# Patient Record
Sex: Female | Born: 1960 | Race: White | Hispanic: No | Marital: Married | Smoking: Former smoker
Health system: Southern US, Community
[De-identification: ages and names within clinical notes are randomized; demographics above are authoritative.]

## PROBLEM LIST (undated history)

## (undated) DIAGNOSIS — R7303 Prediabetes: Secondary | ICD-10-CM

## (undated) DIAGNOSIS — E559 Vitamin D deficiency, unspecified: Secondary | ICD-10-CM

## (undated) DIAGNOSIS — K219 Gastro-esophageal reflux disease without esophagitis: Secondary | ICD-10-CM

## (undated) DIAGNOSIS — J45909 Unspecified asthma, uncomplicated: Secondary | ICD-10-CM

## (undated) DIAGNOSIS — I10 Essential (primary) hypertension: Secondary | ICD-10-CM

## (undated) DIAGNOSIS — L719 Rosacea, unspecified: Secondary | ICD-10-CM

## (undated) DIAGNOSIS — J189 Pneumonia, unspecified organism: Secondary | ICD-10-CM

## (undated) DIAGNOSIS — M1380 Other specified arthritis, unspecified site: Secondary | ICD-10-CM

## (undated) DIAGNOSIS — G4730 Sleep apnea, unspecified: Secondary | ICD-10-CM

## (undated) DIAGNOSIS — E049 Nontoxic goiter, unspecified: Secondary | ICD-10-CM

## (undated) DIAGNOSIS — E039 Hypothyroidism, unspecified: Secondary | ICD-10-CM

## (undated) DIAGNOSIS — M1990 Unspecified osteoarthritis, unspecified site: Secondary | ICD-10-CM

## (undated) DIAGNOSIS — D509 Iron deficiency anemia, unspecified: Secondary | ICD-10-CM

## (undated) HISTORY — DX: Essential (primary) hypertension: I10

## (undated) HISTORY — DX: Iron deficiency anemia, unspecified: D50.9

## (undated) HISTORY — DX: Vitamin D deficiency, unspecified: E55.9

## (undated) HISTORY — DX: Hypothyroidism, unspecified: E03.9

## (undated) HISTORY — DX: Unspecified asthma, uncomplicated: J45.909

## (undated) HISTORY — DX: Prediabetes: R73.03

## (undated) HISTORY — DX: Nontoxic goiter, unspecified: E04.9

## (undated) HISTORY — DX: Sleep apnea, unspecified: G47.30

## (undated) HISTORY — DX: Unspecified osteoarthritis, unspecified site: M19.90

## (undated) HISTORY — DX: Pneumonia, unspecified organism: J18.9

## (undated) HISTORY — DX: Rosacea, unspecified: L71.9

## (undated) HISTORY — DX: Gastro-esophageal reflux disease without esophagitis: K21.9

## (undated) HISTORY — DX: Other specified arthritis, unspecified site: M13.80

---

## 2001-12-26 ENCOUNTER — Encounter: Payer: Self-pay | Admitting: Surgery

## 2001-12-26 ENCOUNTER — Encounter: Admission: RE | Admit: 2001-12-26 | Discharge: 2001-12-26 | Payer: Self-pay | Admitting: Surgery

## 2002-08-06 ENCOUNTER — Encounter: Admission: RE | Admit: 2002-08-06 | Discharge: 2002-08-06 | Payer: Self-pay | Admitting: Surgery

## 2002-08-06 ENCOUNTER — Encounter: Payer: Self-pay | Admitting: Surgery

## 2003-09-02 ENCOUNTER — Ambulatory Visit (HOSPITAL_COMMUNITY): Admission: RE | Admit: 2003-09-02 | Discharge: 2003-09-02 | Payer: Self-pay

## 2004-09-07 ENCOUNTER — Ambulatory Visit (HOSPITAL_COMMUNITY): Admission: RE | Admit: 2004-09-07 | Discharge: 2004-09-07 | Payer: Self-pay

## 2007-03-17 ENCOUNTER — Ambulatory Visit (HOSPITAL_COMMUNITY): Admission: RE | Admit: 2007-03-17 | Discharge: 2007-03-17 | Payer: Self-pay

## 2007-03-24 ENCOUNTER — Encounter: Admission: RE | Admit: 2007-03-24 | Discharge: 2007-03-24 | Payer: Self-pay

## 2007-10-30 ENCOUNTER — Encounter: Admission: RE | Admit: 2007-10-30 | Discharge: 2007-10-30 | Payer: Self-pay

## 2008-06-12 ENCOUNTER — Encounter: Admission: RE | Admit: 2008-06-12 | Discharge: 2008-06-12 | Payer: Self-pay

## 2009-03-10 ENCOUNTER — Encounter: Admission: RE | Admit: 2009-03-10 | Discharge: 2009-03-10 | Payer: Self-pay

## 2010-06-19 ENCOUNTER — Encounter: Admission: RE | Admit: 2010-06-19 | Discharge: 2010-06-19 | Payer: Self-pay

## 2011-05-28 ENCOUNTER — Other Ambulatory Visit: Payer: Self-pay | Admitting: Family Medicine

## 2011-05-28 DIAGNOSIS — Z1231 Encounter for screening mammogram for malignant neoplasm of breast: Secondary | ICD-10-CM

## 2011-06-25 ENCOUNTER — Ambulatory Visit: Payer: Self-pay

## 2011-07-05 ENCOUNTER — Other Ambulatory Visit: Payer: Self-pay | Admitting: Family Medicine

## 2011-07-05 DIAGNOSIS — Z780 Asymptomatic menopausal state: Secondary | ICD-10-CM

## 2011-07-09 ENCOUNTER — Ambulatory Visit
Admission: RE | Admit: 2011-07-09 | Discharge: 2011-07-09 | Disposition: A | Discharge status: Home / Self Care | Payer: BC Managed Care – PPO | Source: Ambulatory Visit | Attending: Family Medicine | Admitting: Family Medicine

## 2011-07-09 ENCOUNTER — Ambulatory Visit: Payer: Self-pay

## 2011-07-09 DIAGNOSIS — Z1231 Encounter for screening mammogram for malignant neoplasm of breast: Secondary | ICD-10-CM

## 2011-07-19 ENCOUNTER — Other Ambulatory Visit: Payer: Self-pay

## 2013-09-03 ENCOUNTER — Other Ambulatory Visit: Payer: Self-pay | Admitting: Family Medicine

## 2013-09-03 DIAGNOSIS — N951 Menopausal and female climacteric states: Secondary | ICD-10-CM

## 2013-09-03 DIAGNOSIS — Z1231 Encounter for screening mammogram for malignant neoplasm of breast: Secondary | ICD-10-CM

## 2013-09-20 ENCOUNTER — Ambulatory Visit: Payer: BC Managed Care – PPO

## 2015-12-15 ENCOUNTER — Encounter: Payer: Self-pay | Admitting: Gastroenterology

## 2015-12-15 HISTORY — PX: COLONOSCOPY: SHX174

## 2016-01-19 DIAGNOSIS — D649 Anemia, unspecified: Secondary | ICD-10-CM | POA: Diagnosis not present

## 2016-05-21 DIAGNOSIS — D509 Iron deficiency anemia, unspecified: Secondary | ICD-10-CM | POA: Diagnosis not present

## 2016-10-28 DIAGNOSIS — Z862 Personal history of diseases of the blood and blood-forming organs and certain disorders involving the immune mechanism: Secondary | ICD-10-CM | POA: Diagnosis not present

## 2017-03-07 HISTORY — PX: ESOPHAGOGASTRODUODENOSCOPY: SHX1529

## 2018-01-25 ENCOUNTER — Telehealth: Payer: Self-pay

## 2018-01-25 MED ORDER — PANTOPRAZOLE SODIUM 40 MG PO TBEC: 40.0000 mg | DELAYED_RELEASE_TABLET | Freq: Every day | ORAL | 5 refills | Status: DC

## 2018-01-25 NOTE — Telephone Encounter (Signed)
Sent refills to patients pharmacy.  

## 2018-05-04 DIAGNOSIS — E039 Hypothyroidism, unspecified: Secondary | ICD-10-CM

## 2018-05-04 DIAGNOSIS — J9601 Acute respiratory failure with hypoxia: Secondary | ICD-10-CM | POA: Diagnosis not present

## 2018-05-04 DIAGNOSIS — Z8709 Personal history of other diseases of the respiratory system: Secondary | ICD-10-CM

## 2018-05-04 DIAGNOSIS — J45909 Unspecified asthma, uncomplicated: Secondary | ICD-10-CM

## 2018-05-04 DIAGNOSIS — K219 Gastro-esophageal reflux disease without esophagitis: Secondary | ICD-10-CM

## 2018-05-04 DIAGNOSIS — F329 Major depressive disorder, single episode, unspecified: Secondary | ICD-10-CM

## 2018-05-04 DIAGNOSIS — I10 Essential (primary) hypertension: Secondary | ICD-10-CM

## 2018-05-04 DIAGNOSIS — J189 Pneumonia, unspecified organism: Secondary | ICD-10-CM

## 2018-05-05 DIAGNOSIS — J45909 Unspecified asthma, uncomplicated: Secondary | ICD-10-CM | POA: Diagnosis not present

## 2018-05-05 DIAGNOSIS — J189 Pneumonia, unspecified organism: Secondary | ICD-10-CM | POA: Diagnosis not present

## 2018-05-05 DIAGNOSIS — F329 Major depressive disorder, single episode, unspecified: Secondary | ICD-10-CM | POA: Diagnosis not present

## 2018-05-05 DIAGNOSIS — J9601 Acute respiratory failure with hypoxia: Secondary | ICD-10-CM | POA: Diagnosis not present

## 2018-05-06 DIAGNOSIS — J9601 Acute respiratory failure with hypoxia: Secondary | ICD-10-CM | POA: Diagnosis not present

## 2018-05-06 DIAGNOSIS — J189 Pneumonia, unspecified organism: Secondary | ICD-10-CM | POA: Diagnosis not present

## 2018-05-06 DIAGNOSIS — F329 Major depressive disorder, single episode, unspecified: Secondary | ICD-10-CM | POA: Diagnosis not present

## 2018-05-06 DIAGNOSIS — J45909 Unspecified asthma, uncomplicated: Secondary | ICD-10-CM | POA: Diagnosis not present

## 2018-08-01 DIAGNOSIS — R0600 Dyspnea, unspecified: Secondary | ICD-10-CM

## 2018-09-11 ENCOUNTER — Encounter: Payer: Self-pay | Admitting: Internal Medicine

## 2018-09-11 DIAGNOSIS — F329 Major depressive disorder, single episode, unspecified: Secondary | ICD-10-CM

## 2018-09-11 DIAGNOSIS — I10 Essential (primary) hypertension: Secondary | ICD-10-CM | POA: Diagnosis not present

## 2018-09-11 DIAGNOSIS — K219 Gastro-esophageal reflux disease without esophagitis: Secondary | ICD-10-CM

## 2018-09-11 DIAGNOSIS — E039 Hypothyroidism, unspecified: Secondary | ICD-10-CM

## 2018-09-11 DIAGNOSIS — J45909 Unspecified asthma, uncomplicated: Secondary | ICD-10-CM | POA: Diagnosis not present

## 2018-09-11 DIAGNOSIS — J9601 Acute respiratory failure with hypoxia: Secondary | ICD-10-CM | POA: Diagnosis not present

## 2018-09-12 DIAGNOSIS — J189 Pneumonia, unspecified organism: Secondary | ICD-10-CM | POA: Diagnosis not present

## 2018-09-12 DIAGNOSIS — J45909 Unspecified asthma, uncomplicated: Secondary | ICD-10-CM | POA: Diagnosis not present

## 2018-09-12 DIAGNOSIS — Z789 Other specified health status: Secondary | ICD-10-CM

## 2018-09-12 DIAGNOSIS — J9601 Acute respiratory failure with hypoxia: Secondary | ICD-10-CM | POA: Diagnosis not present

## 2018-09-13 DIAGNOSIS — J189 Pneumonia, unspecified organism: Secondary | ICD-10-CM | POA: Diagnosis not present

## 2018-09-13 DIAGNOSIS — Z789 Other specified health status: Secondary | ICD-10-CM | POA: Diagnosis not present

## 2018-09-13 DIAGNOSIS — J9601 Acute respiratory failure with hypoxia: Secondary | ICD-10-CM | POA: Diagnosis not present

## 2018-09-13 DIAGNOSIS — J45909 Unspecified asthma, uncomplicated: Secondary | ICD-10-CM | POA: Diagnosis not present

## 2018-09-20 ENCOUNTER — Ambulatory Visit: Payer: Managed Care, Other (non HMO) | Admitting: Allergy and Immunology

## 2018-10-11 ENCOUNTER — Ambulatory Visit: Payer: Managed Care, Other (non HMO) | Admitting: Allergy and Immunology

## 2018-12-21 ENCOUNTER — Telehealth (INDEPENDENT_AMBULATORY_CARE_PROVIDER_SITE_OTHER): Payer: Managed Care, Other (non HMO) | Admitting: Gastroenterology

## 2018-12-21 ENCOUNTER — Encounter: Payer: Self-pay | Admitting: Gastroenterology

## 2018-12-21 ENCOUNTER — Other Ambulatory Visit: Payer: Self-pay

## 2018-12-21 VITALS — Ht 69.0 in | Wt 200.0 lb

## 2018-12-21 DIAGNOSIS — E611 Iron deficiency: Secondary | ICD-10-CM

## 2018-12-21 DIAGNOSIS — K760 Fatty (change of) liver, not elsewhere classified: Secondary | ICD-10-CM

## 2018-12-21 MED ORDER — PANTOPRAZOLE SODIUM 40 MG PO TBEC: 40.0000 mg | DELAYED_RELEASE_TABLET | ORAL | 0 refills | Status: DC

## 2018-12-21 NOTE — Patient Instructions (Signed)
If you are age 60 or older, your body mass index should be between 23-30. Your Body mass index is 29.53 kg/m. If this is out of the aforementioned range listed, please consider follow up with your Primary Care Provider.  If you are age 51 or younger, your body mass index should be between 19-25. Your Body mass index is 29.53 kg/m. If this is out of the aformentioned range listed, please consider follow up with your Primary Care Provider.   To help prevent the possible spread of infection to our patients, communities, and staff; we will be implementing the following measures:  As of now we are not allowing any visitors/family members to accompany you to any upcoming appointments with [facility]. If you have any concerns about this please contact our office to discuss prior to the appointment.   You have been scheduled for an abdominal ultrasound at [facility] (1st floor of hospital) on 12/28/2018 at 8:00am. Please arrive 15 minutes prior to your appointment for registration. Make certain not to have anything to eat or drink 6 hours prior to your appointment. Should you need to reschedule your appointment, please contact radiology at [phone]. This test typically takes about 30 minutes to perform.  We have sent the following medications to your pharmacy for you to pick up at your convenience: Protonix 40mg  every other day for 1 month. If you have no upper GI symptoms after 1 month you may begin to take Protonix 40mg  as needed.  Thank you,  Dr. Jackquline Denmark

## 2018-12-21 NOTE — Progress Notes (Signed)
Chief Complaint: FU  Referring Provider:  Nicoletta Dress, MD      ASSESSMENT AND PLAN;   #1.  Iron deficiency without anemia.  Heme-negative 2018. Neg colon to TI 11/2015, negative EGD 02/2017. CT 12/2015: Negative except for mild splenomegaly, fatty liver.  No liver cirrhosis/portal hypertension.  #2.  Fatty liver with mildly abnormal LFTs. [place] 01/2017-fatty liver, mild splenomegaly 16.1 cm.  No liver cirrhosis/portal hypertension. Neg acute hepatitis panel, AMA, ASMA 11/2015. No ETOH.  Plan: -Ultrasound abdomen with hepatic elastography. -Decrease Protonix to every other day for 1 month.  If there are no upper GI symptoms, then she can use it on PRN basis.  May help iron absorption as well. -Continue iron supplements. -Hemoccult x 3. -Obtain results of CMP, iron studies from Dr. Denton Lank office. -Trend CBC, LFTs.  She is scheduled for blood work towards June end. -Hold off on repeat EGD or colonoscopy at the present time. -Encouraged her to lose weight.   HPI:    Maria Weber is a 58 y.o. female  For iron deficiency without anemia. She is doing well with twice a day iron supplements.  No nausea, vomiting, heartburn, regurgitation, odynophagia or dysphagia.  No significant diarrhea or constipation.  There is no melena or hematochezia. No unintentional weight loss. No abdominal pain.  Denies excessive menstrual cycles/hematuria/nosebleeds.  Has longstanding history of iron deficiency.  No jaundice dark urine or pale stools.    Labs-September 26, 2018 hemoglobin 13.1, MCV 76, platelets 203, WBC count 9.6.  Liver function tests per patient AST/ALT in 50s.  Past GI procedures: -Colonoscopy 12/15/2015 ([facility])-good prep, 6 mm colonic polyp status post polypectomy.  Biopsies hyperplastic, mild sigmoid diverticulosis.  Otherwise normal to TI.  03/2013-hyperplastic colonic polyps, small internal hemorrhoids. -EGD 03/07/2017 normal.  Previously negative small bowel biopsies for  celiac.  Gastric biopsies were negative for intestinal metaplasia at last EGD.  SH-fell in our office previously/had grandbaby with her at that time. Past Medical History:  Diagnosis Date  . Allergic arthritis   . Arthritis   . Asthma   . GERD (gastroesophageal reflux disease)   . Goiter, non-toxic   . Hypertension   . Hypothyroidism   . Iron deficiency anemia   . Pneumonia    october 2019 and february 2020 Oval Linsey   . Rosacea   . Vitamin D deficiency     Past Surgical History:  Procedure Laterality Date  . CESAREAN SECTION     x2  . COLONOSCOPY  12/15/2015   Colonic polyp status post polypectomy. Mild sigmoid diverticulosis. No explanation of iron deficiency anemia  . ESOPHAGOGASTRODUODENOSCOPY  03/07/2017   Mild gastritis. Otherwise normal EGD    Family History  Problem Relation Age of Onset  . Bladder Cancer Father   . Esophageal cancer Neg Hx   . Colon cancer Neg Hx     Social History   Tobacco Use  . Smoking status: Former Research scientist (life sciences)  . Smokeless tobacco: Never Used  . Tobacco comment: smoked for one year when she was 16  Substance Use Topics  . Alcohol use: Not Currently  . Drug use: Never    Current Outpatient Medications  Medication Sig Dispense Refill  . albuterol (VENTOLIN HFA) 108 (90 Base) MCG/ACT inhaler Inhale 1 puff into the lungs as needed for wheezing or shortness of breath.    Francia Greaves THYROID 60 MG tablet Take 60 mg by mouth daily.    Marland Kitchen CO-ENZYME Q10 PO Take 400 mg by mouth  daily.    . ferrous sulfate (IRON SUPPLEMENT) 325 (65 FE) MG tablet Take 325 mg by mouth daily with breakfast.    . fluticasone furoate-vilanterol (BREO ELLIPTA) 200-25 MCG/INH AEPB Inhale 1 puff into the lungs daily.    Marland Kitchen ipratropium-albuterol (DUONEB) 0.5-2.5 (3) MG/3ML SOLN Take 3 mLs by nebulization as needed. 2.5mg /56ml    . lisinopril-hydrochlorothiazide (ZESTORETIC) 20-12.5 MG tablet 0.5 tablets daily.    Marland Kitchen loratadine (CLARITIN) 10 MG tablet Take 10 mg by mouth daily.     . Milk Thstle-Artchke-Dand-Licor (SUPER MILK THISTLE X PO) Take 2 tablets by mouth daily.    . pantoprazole (PROTONIX) 40 MG tablet Take 1 tablet (40 mg total) by mouth daily. 30 tablet 5  . sertraline (ZOLOFT) 100 MG tablet Take 25 mg by mouth daily. Take a quarter tablet daily    . traZODone (DESYREL) 150 MG tablet 1 tablet daily.     No current facility-administered medications for this visit.     Allergies  Allergen Reactions  . Iodine   . Povidone   . Sulfa Antibiotics     Review of Systems:  Constitutional: Denies fever, chills, diaphoresis, appetite change and fatigue.  HEENT: Denies photophobia, eye pain, redness, hearing loss, ear pain, congestion, sore throat, rhinorrhea, sneezing, mouth sores, neck pain, neck stiffness and tinnitus.   Respiratory: Denies SOB, DOE, cough, chest tightness,  and wheezing.   Cardiovascular: Denies chest pain, palpitations and leg swelling.  Genitourinary: Denies dysuria, urgency, frequency, hematuria, flank pain and difficulty urinating.  Musculoskeletal: Denies myalgias, back pain, joint swelling, arthralgias and gait problem.  Skin: No rash.  Neurological: Denies dizziness, seizures, syncope, weakness, light-headedness, numbness and headaches.  Hematological: Denies adenopathy. Easy bruising, personal or family bleeding history  Psychiatric/Behavioral: No anxiety or depression     Physical Exam:    Ht 5\' 9"  (1.753 m)   Wt 200 lb (90.7 kg)   BMI 29.53 kg/m  Filed Weights   12/21/18 1109  Weight: 200 lb (90.7 kg)   Constitutional:  Well-developed, in no acute distress. Psychiatric: Normal mood and affect. Behavior is normal. Telemetry visit  Data Reviewed: I have personally reviewed following labs and imaging studies  Labs -September 26, 2018 hemoglobin 13.1, MCV 76, platelets 203, WBC count 9.6.  Liver function tests per patient AST/ALT in 50s.   This service was provided via telemedicine.  The patient was located at home.  The  provider was located in office.  The patient did consent to this telephone visit and is aware of possible charges through their insurance for this visit.  The patient was referred by Dr. Delena Bali.   Time spent on call: 25 min   Carmell Austria, MD 12/21/2018, 2:46 PM  Cc: Nicoletta Dress, MD

## 2018-12-28 ENCOUNTER — Ambulatory Visit (HOSPITAL_COMMUNITY)
Admission: RE | Admit: 2018-12-28 | Discharge: 2018-12-28 | Disposition: A | Discharge status: Home / Self Care | Payer: Managed Care, Other (non HMO) | Source: Ambulatory Visit | Attending: Gastroenterology | Admitting: Gastroenterology

## 2018-12-28 ENCOUNTER — Other Ambulatory Visit: Payer: Self-pay

## 2018-12-28 DIAGNOSIS — K760 Fatty (change of) liver, not elsewhere classified: Secondary | ICD-10-CM | POA: Diagnosis present

## 2018-12-28 DIAGNOSIS — E611 Iron deficiency: Secondary | ICD-10-CM | POA: Insufficient documentation

## 2018-12-29 ENCOUNTER — Other Ambulatory Visit: Payer: Self-pay | Admitting: *Deleted

## 2018-12-29 MED ORDER — VITAMIN E 180 MG (400 UNIT) PO CAPS: 400.0000 [IU] | ORAL_CAPSULE | Freq: Every day | ORAL | 1 refills | Status: AC

## 2019-01-05 ENCOUNTER — Other Ambulatory Visit: Payer: Managed Care, Other (non HMO)

## 2019-01-11 ENCOUNTER — Other Ambulatory Visit (INDEPENDENT_AMBULATORY_CARE_PROVIDER_SITE_OTHER): Payer: Managed Care, Other (non HMO)

## 2019-01-11 ENCOUNTER — Other Ambulatory Visit: Payer: Self-pay

## 2019-01-11 DIAGNOSIS — E611 Iron deficiency: Secondary | ICD-10-CM | POA: Diagnosis not present

## 2019-01-11 LAB — HEMOCCULT SLIDES (X 3 CARDS)
Fecal Occult Blood: NEGATIVE
OCCULT 1: NEGATIVE
OCCULT 2: NEGATIVE
OCCULT 3: NEGATIVE
OCCULT 4: NEGATIVE
OCCULT 5: NEGATIVE

## 2019-03-16 ENCOUNTER — Telehealth (INDEPENDENT_AMBULATORY_CARE_PROVIDER_SITE_OTHER): Payer: Managed Care, Other (non HMO) | Admitting: Gastroenterology

## 2019-03-16 ENCOUNTER — Other Ambulatory Visit: Payer: Self-pay

## 2019-03-16 VITALS — Ht 69.0 in | Wt 200.0 lb

## 2019-03-16 DIAGNOSIS — K219 Gastro-esophageal reflux disease without esophagitis: Secondary | ICD-10-CM | POA: Diagnosis not present

## 2019-03-16 DIAGNOSIS — K760 Fatty (change of) liver, not elsewhere classified: Secondary | ICD-10-CM

## 2019-03-16 NOTE — Patient Instructions (Signed)
Decrease Protonix 40 mg po qd. -Start exercising and reduce weight.  Exercise like walking 30 min/day, at least 3 x week.  -Record wt every day and bring it along the at the next FU visit. -Change diet to more grilled foods rather than fried foods. Can try Mediterranean diet. -Stop drinking sodas strictly.  Avoid foods with high fructose corn syrup. -Can have up to 2 cups of coffee per day. -Continue Vitamin E 400 IU po QD. -FU in 12 weeks.  Aim is to reduce 6 lbs over next 12 weeks. -At Dr Denton Lank office (Oct 30) CBC, CMP, anti-HAV total Ab and HBsAb titer.  If neg, would need vaccination for hepatitis A and B. -Continue milk thistle bid.

## 2019-03-16 NOTE — Progress Notes (Signed)
Chief Complaint: FU  Referring Provider:  Nicoletta Dress, MD      ASSESSMENT AND PLAN;   #1.  Iron deficiency without anemia.  Heme-negative 2018/2020. Neg colon to TI 11/2015, negative EGD 02/2017. CT 12/2015: Negative except for mild splenomegaly, fatty liver.  No liver cirrhosis/portal hypertension.  #2.  Fatty liver with mildly abn LFTs. USE 12/2018 (F2/some F3 fibrosis). [place] 01/2017-fatty liver, mild splenomegaly 16.1 cm.  No liver cirrhosis/portal hypertension. Neg acute hepatitis panel, ANA, AMA, ASMA 11/2015. No ETOH.  Plan: -Decrease Protonix 40 mg po qd. -Start exercising and reduce weight.  Exercise like walking 30 min/day, at least 3 x week.  -Record wt every day and bring it along the at the next FU visit. -Change diet to more grilled foods rather than fried foods. Can try Mediterranean diet. -Stop drinking sodas strictly.  Avoid foods with high fructose corn syrup. -Can have up to 2 cups of coffee per day. -Continue Vitamin E 400 IU po QD. -FU in 12 weeks.  Aim is to reduce 6 lbs over next 12 weeks. -At Dr Denton Lank office (Oct 30) CBC, CMP, anti-HAV total Ab and HBsAb titer.  If neg, would need vaccination for hepatitis A and B. -Continue milk thistle bid. -Hold off on repeat EGD or colonoscopy at the present time.   HPI:    Maria Weber is a 58 y.o. female  For iron deficiency without anemia. Had heme-negative stools She is doing well with twice a day iron supplements.  No nausea, vomiting, heartburn (has started taking Protonix once a day, had burning when she was taking it every other day), regurgitation, odynophagia or dysphagia.  No significant diarrhea or constipation.  There is no melena or hematochezia.   May have gained little weight.  Could not tell me as to how much.  Ultrasound elastography showed F2/some F3 fibrosis.  At moderate risk for cirrhosis. Spleen was mildly enlarged.  No portal hypertension.  Platelets were normal previously.  No  abdominal pain.  Denies excessive menstrual cycles/hematuria/nosebleeds.  Has longstanding history of iron deficiency.  No jaundice dark urine or pale stools.    Labs-September 26, 2018 hemoglobin 13.1, MCV 76, platelets 203, WBC count 9.6.  Liver function tests per patient AST/ALT in 50s.  Past GI procedures: -Colonoscopy 12/15/2015 ([facility])-good prep, 6 mm colonic polyp status post polypectomy.  Biopsies hyperplastic, mild sigmoid diverticulosis.  Otherwise normal to TI.  03/2013-hyperplastic colonic polyps, small internal hemorrhoids. -EGD 03/07/2017 normal.  Previously negative small bowel biopsies for celiac.  Gastric biopsies were negative for intestinal metaplasia at last EGD.  SH-fell in our office previously/had grandbaby with her at that time. Past Medical History:  Diagnosis Date  . Allergic arthritis   . Arthritis   . Asthma   . GERD (gastroesophageal reflux disease)   . Goiter, non-toxic   . Hypertension   . Hypothyroidism   . Iron deficiency anemia   . Pneumonia    october 2019 and february 2020 Oval Linsey   . Rosacea   . Vitamin D deficiency     Past Surgical History:  Procedure Laterality Date  . CESAREAN SECTION     x2  . COLONOSCOPY  12/15/2015   Colonic polyp status post polypectomy. Mild sigmoid diverticulosis. No explanation of iron deficiency anemia  . ESOPHAGOGASTRODUODENOSCOPY  03/07/2017   Mild gastritis. Otherwise normal EGD    Family History  Problem Relation Age of Onset  . Bladder Cancer Father   . Esophageal cancer Neg Hx   .  Colon cancer Neg Hx     Social History   Tobacco Use  . Smoking status: Former Research scientist (life sciences)  . Smokeless tobacco: Never Used  . Tobacco comment: smoked for one year when she was 16  Substance Use Topics  . Alcohol use: Not Currently  . Drug use: Never    Current Outpatient Medications  Medication Sig Dispense Refill  . albuterol (VENTOLIN HFA) 108 (90 Base) MCG/ACT inhaler Inhale 1 puff into the lungs as needed for  wheezing or shortness of breath.    Francia Greaves THYROID 60 MG tablet Take 60 mg by mouth daily.    Marland Kitchen CO-ENZYME Q10 PO Take 400 mg by mouth daily.    . ferrous sulfate (IRON SUPPLEMENT) 325 (65 FE) MG tablet Take 325 mg by mouth daily with breakfast.    . fluticasone furoate-vilanterol (BREO ELLIPTA) 200-25 MCG/INH AEPB Inhale 1 puff into the lungs daily.    Marland Kitchen ipratropium-albuterol (DUONEB) 0.5-2.5 (3) MG/3ML SOLN Take 3 mLs by nebulization as needed. 2.5mg /17ml    . lisinopril-hydrochlorothiazide (ZESTORETIC) 20-12.5 MG tablet 0.5 tablets daily.    Marland Kitchen loratadine (CLARITIN) 10 MG tablet Take 10 mg by mouth as needed.     . Milk Thstle-Artchke-Dand-Licor (SUPER MILK THISTLE X PO) Take 2 tablets by mouth daily.    . pantoprazole (PROTONIX) 40 MG tablet Take 1 tablet (40 mg total) by mouth every other day. (Patient taking differently: Take 40 mg by mouth daily. ) 90 tablet 0  . traZODone (DESYREL) 150 MG tablet 1 tablet daily.     No current facility-administered medications for this visit.     Allergies  Allergen Reactions  . Iodine   . Povidone   . Sulfa Antibiotics     Review of Systems:  neg     Physical Exam:    Ht 5\' 9"  (1.753 m)   Wt 200 lb (90.7 kg)   BMI 29.53 kg/m  Filed Weights   03/16/19 0811  Weight: 200 lb (90.7 kg)   Constitutional:  Well-developed, in no acute distress. Psychiatric: Normal mood and affect. Behavior is normal. Telemetry visit  Data Reviewed: I have personally reviewed following labs and imaging studies  Labs -September 26, 2018 hemoglobin 13.1, MCV 76, platelets 203, WBC count 9.6.  Liver function tests per patient AST/ALT in 50s.   This service was provided via telemedicine.  The patient was located at home.  The provider was located in office.  The patient did consent to this telephone visit and is aware of possible charges through their insurance for this visit.  The patient was referred by Dr. Delena Bali.   Time spent on call: 25 min   Carmell Austria,  MD 03/16/2019, 1:39 PM  Cc: Nicoletta Dress, MD Anda Kraft)

## 2019-04-02 ENCOUNTER — Ambulatory Visit: Payer: Managed Care, Other (non HMO) | Admitting: Gastroenterology

## 2019-06-19 ENCOUNTER — Telehealth: Payer: Self-pay

## 2019-06-19 NOTE — Telephone Encounter (Signed)
Called patient and she told me that she has received her first hep A/B on 11/17 and will be getting her next injection of hep B only on 1/17 through [facility] that was ordered by PCP. Patient wants to know if this is right way to do her injection? She is coming in for her OV on 1/29

## 2019-06-21 NOTE — Telephone Encounter (Signed)
That should be fine RG 

## 2019-06-22 NOTE — Telephone Encounter (Signed)
Called patient and left voicemail.  Please let her know to continue with the orders from her PCP in regards to her vaccination

## 2019-06-25 NOTE — Telephone Encounter (Signed)
Patient called back and was told to continue to PCP recommendations for her vaccination

## 2019-07-26 ENCOUNTER — Ambulatory Visit (INDEPENDENT_AMBULATORY_CARE_PROVIDER_SITE_OTHER): Payer: Managed Care, Other (non HMO) | Admitting: Allergy and Immunology

## 2019-07-26 ENCOUNTER — Other Ambulatory Visit: Payer: Self-pay

## 2019-07-26 ENCOUNTER — Encounter: Payer: Self-pay | Admitting: Allergy and Immunology

## 2019-07-26 VITALS — BP 130/80 | HR 88 | Temp 97.4°F | Resp 18 | Ht 67.0 in | Wt 205.0 lb

## 2019-07-26 DIAGNOSIS — L500 Allergic urticaria: Secondary | ICD-10-CM | POA: Diagnosis not present

## 2019-07-26 DIAGNOSIS — R7989 Other specified abnormal findings of blood chemistry: Secondary | ICD-10-CM | POA: Diagnosis not present

## 2019-07-26 DIAGNOSIS — L299 Pruritus, unspecified: Secondary | ICD-10-CM | POA: Diagnosis not present

## 2019-07-26 DIAGNOSIS — D7219 Other eosinophilia: Secondary | ICD-10-CM | POA: Diagnosis not present

## 2019-07-26 NOTE — Progress Notes (Signed)
[place] [facility] [place] [facility] [place] - [place]   Dear Dr. Delena Bali,  Thank you for referring Maria Weber to the [facility] on 07/26/2019.   Below is a summation of this patient's evaluation and recommendations.  Thank you for your referral. I will keep you informed about this patient's response to treatment.   If you have any questions please do not hesitate to contact me.   Sincerely,  Jiles Prows, MD Allergy / Immunology [facility]   ______________________________________________________________________    NEW PATIENT NOTE  Referring Provider: Nicoletta Dress, MD Primary Provider: Nicoletta Dress, MD Date of office visit: 07/26/2019    Subjective:   Chief Complaint:  Maria Weber (DOB: 10/15/1960) is a 59 y.o. female who presents to the clinic on 07/26/2019 with a chief complaint of Allergic Reaction (to allergy shots ) .     HPI: Maria Weber presents to this clinic in evaluation of a pruritic dermatitis.  Apparently since October 2020 she has been having red raised itchy lesions across her body involving mostly her torso and her lower extremities.  These lesions never heal with scar or hyperpigmentation but if she scratches her legs significantly she will develop what appeared to be "healing blisters".  There is never any associated systemic or constitutional symptoms with these issues and there is no obvious provoking factor giving rise to this issue.  She was started on immunotherapy in the spring 2020.  Apparently she received this immunotherapy because of "pneumonia".  Apparently she had two pneumonias over the course of 2 years.  She really did not have a significant amount of nasal symptoms.  She does have a history of asthma but this was under excellent control and she rarely uses a short acting bronchodilator.  She discontinued her immunotherapy November  2020.  She uses supplements with thistle and co-Q10.  She has been on co-Q10 since 2019 and thistle since 2018.  She has a history of elevated liver function test and has been evaluated by Dr. Lyndel Safe.  Past Medical History:  Diagnosis Date  . Allergic arthritis   . Arthritis   . Asthma   . GERD (gastroesophageal reflux disease)   . Goiter, non-toxic   . Hypertension   . Hypothyroidism   . Iron deficiency anemia   . Pneumonia    october 2019 and february 2020 Maria Weber   . Rosacea   . Vitamin D deficiency     Past Surgical History:  Procedure Laterality Date  . CESAREAN SECTION     x2  . COLONOSCOPY  12/15/2015   Colonic polyp status post polypectomy. Mild sigmoid diverticulosis. No explanation of iron deficiency anemia  . ESOPHAGOGASTRODUODENOSCOPY  03/07/2017   Mild gastritis. Otherwise normal EGD    Allergies as of 07/26/2019      Reactions   Iodine    Povidone    Sulfa Antibiotics       Medication List    Armour Thyroid 60 MG tablet Generic drug: thyroid Take 60 mg by mouth daily.   CO-ENZYME Q10 PO Take 400 mg by mouth daily.   Iron Supplement 325 (65 FE) MG tablet Generic drug: ferrous sulfate Take 325 mg by mouth daily with breakfast.   lisinopril-hydrochlorothiazide 20-12.5 MG tablet Commonly known as: ZESTORETIC 0.5 tablets daily.   montelukast 10 MG tablet Commonly known as: SINGULAIR Take 10 mg by mouth daily.   pantoprazole 40  MG tablet Commonly known as: Protonix Take 1 tablet (40 mg total) by mouth every other day. What changed: when to take this   SUPER MILK THISTLE X PO Take 2 tablets by mouth daily.   traZODone 150 MG tablet Commonly known as: DESYREL 1 tablet daily.   Trulicity 1.5 0000000 Sopn Generic drug: Dulaglutide 1 (ONE) SOLUTION PEN INJECTOR WEEKLY   VITAMIN E PO Take by mouth.   ZINC PO Take by mouth.       Review of systems negative except as noted in HPI / PMHx or noted below:  Review of Systems    Constitutional: Negative.   HENT: Negative.   Eyes: Negative.   Respiratory: Negative.   Cardiovascular: Negative.   Gastrointestinal: Negative.   Genitourinary: Negative.   Musculoskeletal: Negative.   Skin: Negative.   Neurological: Negative.   Endo/Heme/Allergies: Negative.   Psychiatric/Behavioral: Negative.     Family History  Problem Relation Age of Onset  . Bladder Cancer Father   . Diabetes Paternal Uncle   . Esophageal cancer Neg Hx   . Colon cancer Neg Hx     Social History   Socioeconomic History  . Marital status: Married    Spouse name: Not on file  . Number of children: Not on file  . Years of education: Not on file  . Highest education level: Not on file  Occupational History  . Not on file  Tobacco Use  . Smoking status: Former Research scientist (life sciences)  . Smokeless tobacco: Never Used  . Tobacco comment: smoked for one year when she was 16  Substance and Sexual Activity  . Alcohol use: Not Currently  . Drug use: Never  . Sexual activity: Not on file  Other Topics Concern  . Not on file  Social History Narrative  . Not on file   Social Determinants of Health   Financial Resource Strain:   . Difficulty of Paying Living Expenses: Not on file  Food Insecurity:   . Worried About Charity fundraiser in the Last Year: Not on file  . Ran Out of Food in the Last Year: Not on file  Transportation Needs:   . Lack of Transportation (Medical): Not on file  . Lack of Transportation (Non-Medical): Not on file  Physical Activity:   . Days of Exercise per Week: Not on file  . Minutes of Exercise per Session: Not on file  Stress:   . Feeling of Stress : Not on file  Social Connections:   . Frequency of Communication with Friends and Family: Not on file  . Frequency of Social Gatherings with Friends and Family: Not on file  . Attends Religious Services: Not on file  . Active Member of Clubs or Organizations: Not on file  . Attends Archivist Meetings: Not on  file  . Marital Status: Not on file  Intimate Partner Violence:   . Fear of Current or Ex-Partner: Not on file  . Emotionally Abused: Not on file  . Physically Abused: Not on file  . Sexually Abused: Not on file    Environmental and Social history  Lives in a house with a dry environment, no animals located inside the household, carpet in the bedroom, no plastic on the bed, no plastic on the pillow, no smoking located inside the household.  Objective:   Vitals:   07/26/19 1348  BP: 130/80  Pulse: 88  Resp: 18  Temp: (!) 97.4 F (36.3 C)  SpO2: 97%   Height: 5'  7" (170.2 cm) Weight: 205 lb (93 kg)  Physical Exam Constitutional:      Appearance: She is not diaphoretic.  HENT:     Head: Normocephalic.     Right Ear: Tympanic membrane, ear canal and external ear normal.     Left Ear: Tympanic membrane, ear canal and external ear normal.     Nose: Nose normal. No mucosal edema or rhinorrhea.     Mouth/Throat:     Pharynx: Uvula midline. No oropharyngeal exudate.  Eyes:     Conjunctiva/sclera: Conjunctivae normal.  Neck:     Thyroid: No thyromegaly.     Trachea: Trachea normal. No tracheal tenderness or tracheal deviation.  Cardiovascular:     Rate and Rhythm: Normal rate and regular rhythm.     Heart sounds: Normal heart sounds, S1 normal and S2 normal. No murmur.  Pulmonary:     Effort: No respiratory distress.     Breath sounds: Normal breath sounds. No stridor. No wheezing or rales.  Lymphadenopathy:     Head:     Right side of head: No tonsillar adenopathy.     Left side of head: No tonsillar adenopathy.     Cervical: No cervical adenopathy.  Skin:    Findings: No erythema or rash.     Nails: There is no clubbing.  Neurological:     Mental Status: She is alert.     Diagnostics: Allergy skin tests were not performed.   Review of blood tests obtained 02 June 2019 refers to WBC 9.0, absolute eosinophil 800, absolute lymphocyte 2500, hemoglobin 14.8,  platelet 206, hepatitis S antigen negative, creatinine 0.83 MGs/DL, AST 45U/L, ALT 46U/L,  Assessment and Plan:    1. Allergic urticaria   2. Pruritic disorder   3. Other eosinophilia   4. Elevated liver function tests     1.  Review all blood test from 2020.  Further evaluation?  2.  Eliminate coenzyme Q and thistle supplement  3.  Claritin 10 mg - 1-2 tablets 1-2 times per day (max =40 mg/daily)  4.  Contact clinic if dermatitis returns  5.  Do not restart immunotherapy  6.  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  We will have her eliminate her OTC supplements and use a dose of Claritin up to 40 mg daily to see if we can control this issue.  If she continues to have problems I have asked her to contact me for further evaluation and treatment.  Because this issue did appear to start while utilizing a course of immunotherapy I would not recommend that she restart immunotherapy at this point as there was really not a very good indication for starting immunotherapy in the first place.  I will review all of her blood test that have been performed in the past especially in investigation of her elevated liver function tests.  Jiles Prows, MD Allergy / Immunology [facility]

## 2019-07-26 NOTE — Patient Instructions (Addendum)
  1.  Review all blood test from 2020.  Further evaluation?  2.  Eliminate coenzyme Q and thistle supplement  3.  Claritin 10 mg - 1-2 tablets 1-2 times per day (max =40 mg/daily)  4.  Contact clinic if dermatitis returns  5.  Do not restart immunotherapy  6.  Obtain Covid vaccine

## 2019-08-06 ENCOUNTER — Encounter: Payer: Self-pay | Admitting: Allergy and Immunology

## 2019-08-17 ENCOUNTER — Telehealth (INDEPENDENT_AMBULATORY_CARE_PROVIDER_SITE_OTHER): Payer: Managed Care, Other (non HMO) | Admitting: Gastroenterology

## 2019-08-17 ENCOUNTER — Other Ambulatory Visit: Payer: Self-pay

## 2019-08-17 VITALS — Ht 67.0 in | Wt 204.0 lb

## 2019-08-17 DIAGNOSIS — E611 Iron deficiency: Secondary | ICD-10-CM

## 2019-08-17 DIAGNOSIS — K760 Fatty (change of) liver, not elsewhere classified: Secondary | ICD-10-CM | POA: Diagnosis not present

## 2019-08-17 MED ORDER — PANTOPRAZOLE SODIUM 40 MG PO TBEC: 40.0000 mg | DELAYED_RELEASE_TABLET | ORAL | 6 refills | Status: DC

## 2019-08-17 MED ORDER — PANTOPRAZOLE SODIUM 40 MG PO TBEC: 40.0000 mg | DELAYED_RELEASE_TABLET | Freq: Two times a day (BID) | ORAL | 6 refills | Status: DC

## 2019-08-17 NOTE — Progress Notes (Signed)
Chief Complaint: FU  Referring Provider:  Nicoletta Dress, MD      ASSESSMENT AND PLAN;   #1.  Iron deficiency without anemia.  Heme-neg 2018/2020. Neg colon to TI 11/2015, neg EGD 02/2017. CT 12/2015: Neg except for mild splenomegaly, fatty liver.  No liver cirrhosis/portal hypertension. Nl Plts.  #2.  Fatty liver with mildly abn LFTs. USE 12/2018 (F2/some F3 fibrosis). [place] 01/2017-fatty liver, mild splenomegaly 16.1 cm.  No liver cirrhosis/portal hypertension. Neg acute hepatitis panel, ANA, AMA, ASMA 11/2015. No ETOH. S/P vaccine for A and B.  #3. GERD  Plan: -Protonix 40 mg po BID (#60), 6 refills. -Start exercising and reduce weight.  Exercise like walking 30 min/day, at least 3 x week.  -Record wt every day and bring it along the at the next FU visit. -Change diet to more grilled foods rather than fried foods. Can try Mediterranean diet. -Can have up to 2 cups of coffee per day. -Continue Vitamin E 400 IU po QD. -FU in 24 weeks.  Aim is to reduce 6 lbs more over next 24 weeks (aim 196lb) -Continue milk thistle bid.   HPI:    Maria Weber is a 59 y.o. female  For iron deficiency without anemia. Had heme-negative stools She is doing well with twice a day iron supplements.  Has been having heart burn when she takes once a day Protonix.  She was doing much better with twice a day.  Would like to go back on it.  She understands the risks and benefits.  No nausea, vomiting,regurgitation, odynophagia or dysphagia.  No significant diarrhea or constipation.  There is no melena or hematochezia.  No abdominal pain.  Has lost 6 pounds.  On her scale she currently weighs 202 pounds.  This was despite being on prednisone for skin rash previously.  She has been started on Trulicity for prediabetes.  Has been walking regularly except lately when the weather is cold.   Ultrasound elastography showed F2/some F3 fibrosis.  At moderate risk for cirrhosis. Spleen was mildly enlarged.   No portal hypertension.  Platelets were normal previously.  No abdominal pain.  Denies vaginal bleeding/hematuria/nosebleeds.  No jaundice dark urine or pale stools.  Labs-September 26, 2018 hemoglobin 13.1, MCV 76, platelets 203, WBC count 9.6.  Liver function tests per patient AST/ALT in 50s. Labs from 06/02/2019: Hemoglobin 14.8, MCV 81.  Platelets 206K AST 45, ALT 46, albumin 4.3, sodium 145, creatinine 0.8, glucose 103.  Past GI procedures: -Colonoscopy 12/15/2015 ([facility])-good prep, 6 mm colonic polyp status post polypectomy.  Biopsies hyperplastic, mild sigmoid diverticulosis.  Otherwise normal to TI.  03/2013-hyperplastic colonic polyps, small internal hemorrhoids. -EGD 03/07/2017 normal.  Previously negative small bowel biopsies for celiac.  Gastric biopsies were negative for intestinal metaplasia at last EGD.  SH-fell in our office previously/had grandbaby with her at that time. Past Medical History:  Diagnosis Date  . Allergic arthritis   . Arthritis   . Asthma   . GERD (gastroesophageal reflux disease)   . Goiter, non-toxic   . Hypertension   . Hypothyroidism   . Iron deficiency anemia   . Pneumonia    october 2019 and february 2020 [place]   . Rosacea   . Vitamin D deficiency     Past Surgical History:  Procedure Laterality Date  . CESAREAN SECTION     x2  . COLONOSCOPY  12/15/2015   Colonic polyp status post polypectomy. Mild sigmoid diverticulosis. No explanation of iron deficiency anemia  .  ESOPHAGOGASTRODUODENOSCOPY  03/07/2017   Mild gastritis. Otherwise normal EGD    Family History  Problem Relation Age of Onset  . Bladder Cancer Father   . Diabetes Paternal Uncle   . Esophageal cancer Neg Hx   . Colon cancer Neg Hx     Social History   Tobacco Use  . Smoking status: Former Research scientist (life sciences)  . Smokeless tobacco: Never Used  . Tobacco comment: smoked for one year when she was 16  Substance Use Topics  . Alcohol use: Not Currently  . Drug use: Never     Current Outpatient Medications  Medication Sig Dispense Refill  . ARMOUR THYROID 60 MG tablet Take 60 mg by mouth daily.    Marland Kitchen CO-ENZYME Q10 PO Take 400 mg by mouth daily.    . ferrous sulfate (IRON SUPPLEMENT) 325 (65 FE) MG tablet Take 325 mg by mouth daily with breakfast.    . lisinopril-hydrochlorothiazide (ZESTORETIC) 20-12.5 MG tablet 0.5 tablets daily.    . Milk Thstle-Artchke-Dand-Licor (SUPER MILK THISTLE X PO) Take 2 tablets by mouth daily.    . montelukast (SINGULAIR) 10 MG tablet Take 10 mg by mouth daily.    . Multiple Vitamins-Minerals (ZINC PO) Take by mouth.    . pantoprazole (PROTONIX) 40 MG tablet Take 1 tablet (40 mg total) by mouth every other day. (Patient taking differently: Take 40 mg by mouth daily. ) 90 tablet 0  . traZODone (DESYREL) 150 MG tablet 1 tablet daily.    . TRULICITY 1.5 0000000 SOPN 1 (ONE) SOLUTION PEN INJECTOR WEEKLY    . VITAMIN E PO Take by mouth.     No current facility-administered medications for this visit.    Allergies  Allergen Reactions  . Iodine   . Povidone   . Sulfa Antibiotics     Review of Systems:  neg     Physical Exam:    Ht 5\' 7"  (1.702 m)   Wt 204 lb (92.5 kg)   BMI 31.95 kg/m  Filed Weights   08/17/19 0807  Weight: 204 lb (92.5 kg)   Constitutional:  Well-developed, in no acute distress. Psychiatric: Normal mood and affect. Behavior is normal. Telemetry visit  Data Reviewed: I have personally reviewed following labs and imaging studies  Labs -September 26, 2018 hemoglobin 13.1, MCV 76, platelets 203, WBC count 9.6.  Liver function tests per patient AST/ALT in 50s.   This service was provided via telemedicine.  The patient was located at home.  The provider was located in office.  The patient did consent to this telephone visit and is aware of possible charges through their insurance for this visit.  The patient was referred by Dr. Delena Bali.   Time spent on call: 25 min   Carmell Austria, MD 08/17/2019, 11:17  AM  Cc: Nicoletta Dress, MD [facility])

## 2019-08-17 NOTE — Patient Instructions (Signed)
If you are age 61 or older, your body mass index should be between 23-30. Your Body mass index is 31.95 kg/m. If this is out of the aforementioned range listed, please consider follow up with your Primary Care Provider.  If you are age 39 or younger, your body mass index should be between 19-25. Your Body mass index is 31.95 kg/m. If this is out of the aformentioned range listed, please consider follow up with your Primary Care Provider.   We have sent the following medications to your pharmacy for you to pick up at your convenience: Protonix   Follow up in 6 months.   Thank you,  Dr. Jackquline Denmark

## 2019-08-20 ENCOUNTER — Telehealth: Payer: Self-pay | Admitting: *Deleted

## 2019-08-20 NOTE — Telephone Encounter (Signed)
ERROR

## 2019-08-29 ENCOUNTER — Telehealth: Payer: Self-pay | Admitting: Gastroenterology

## 2019-08-29 NOTE — Telephone Encounter (Signed)
Pt states that pantoprazole requires PA because dose was changed from 1 pill/day to 1 BID.

## 2019-08-29 NOTE — Telephone Encounter (Signed)
I have spoke to patients pharmacy who said the medication is covered and will only cost the patient $7, patient informed.

## 2019-09-09 IMAGING — US US ABDOMEN COMPLETE WITH ELASTOGRAPHY
1 series · 12 of 22 positions shown · non-contrast
Comparison: 02/03/2017 abdominal sonogram. 12/23/2015 CT
abdomen/pelvis.

CLINICAL DATA: Hepatic steatosis.



[Series 1: us abdomen complete with elastography · 12 of 22 slices shown]
[im 1/22]
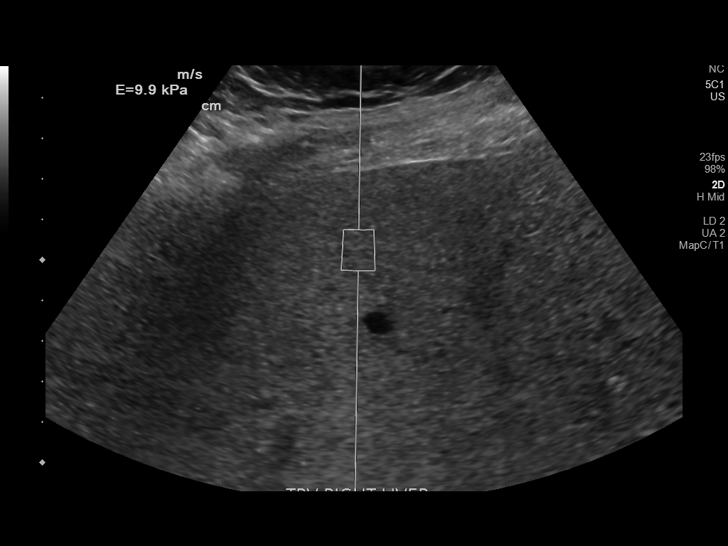
[im 3/22]
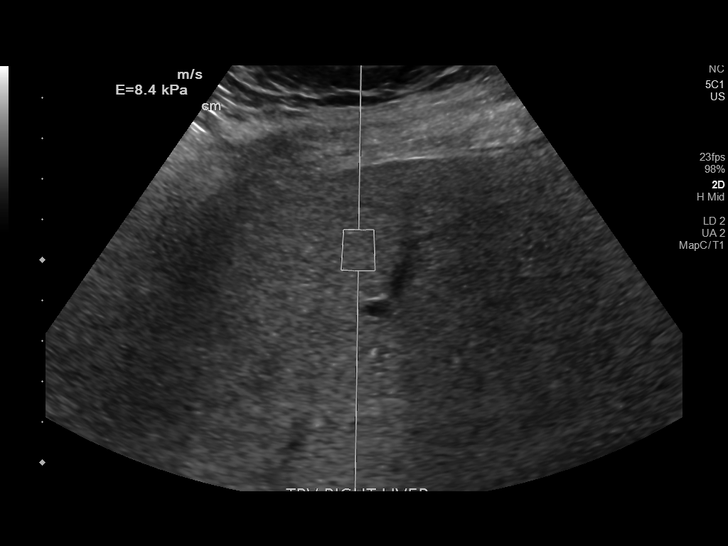
[im 5/22]
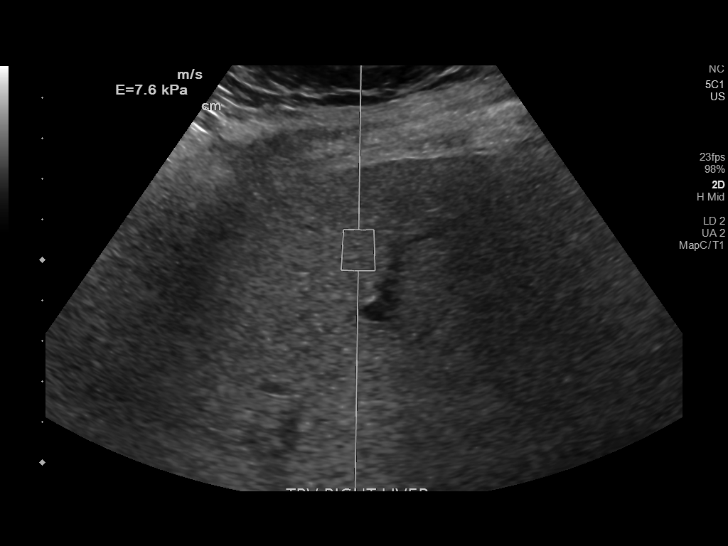
[im 7/22]
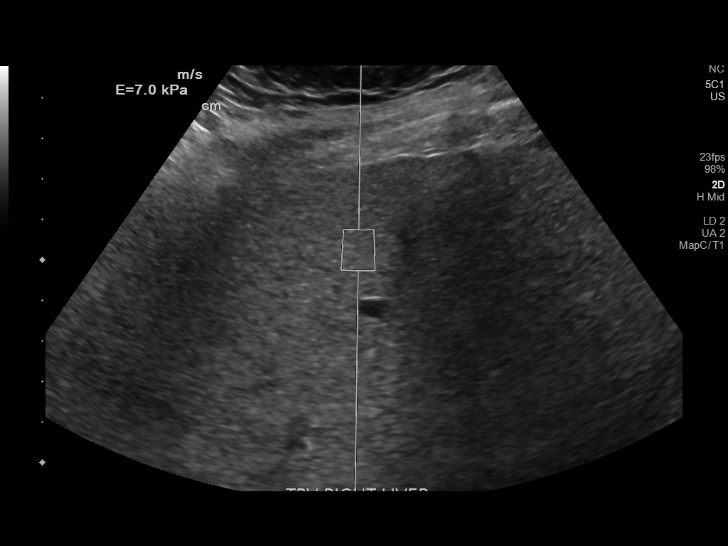
[im 9/22]
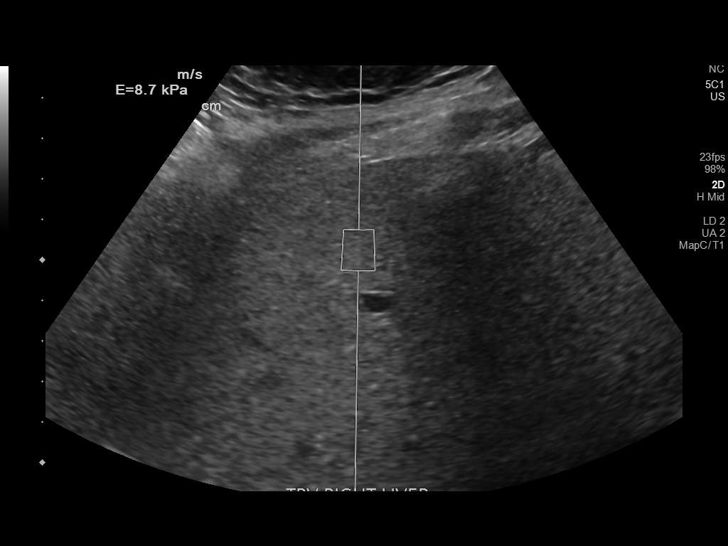
[im 11/22]
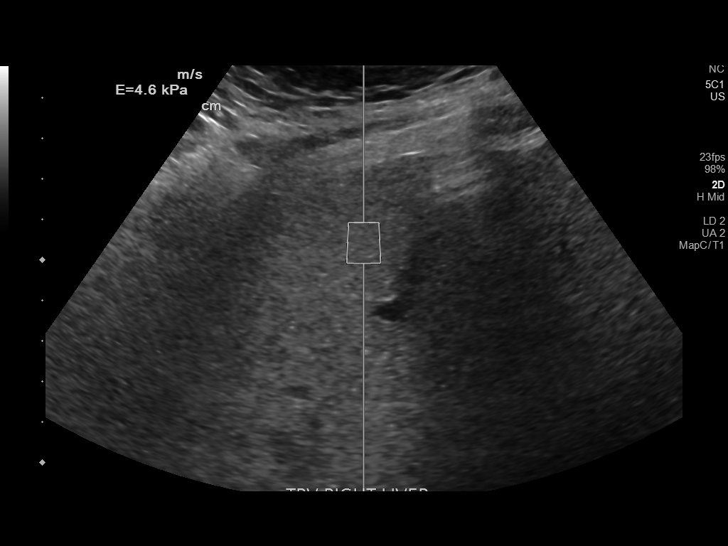
[im 12/22]
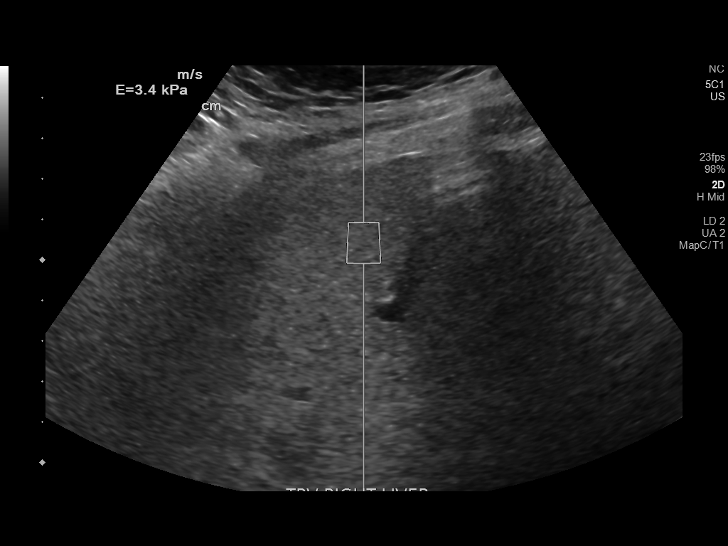
[im 14/22]
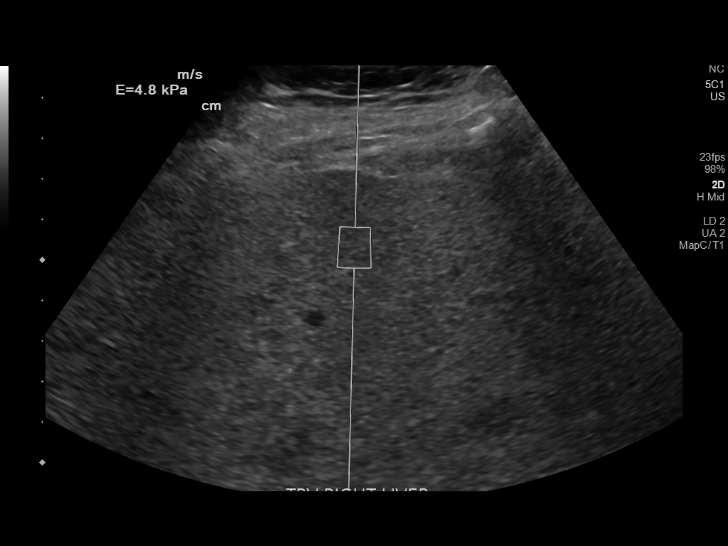
[im 16/22]
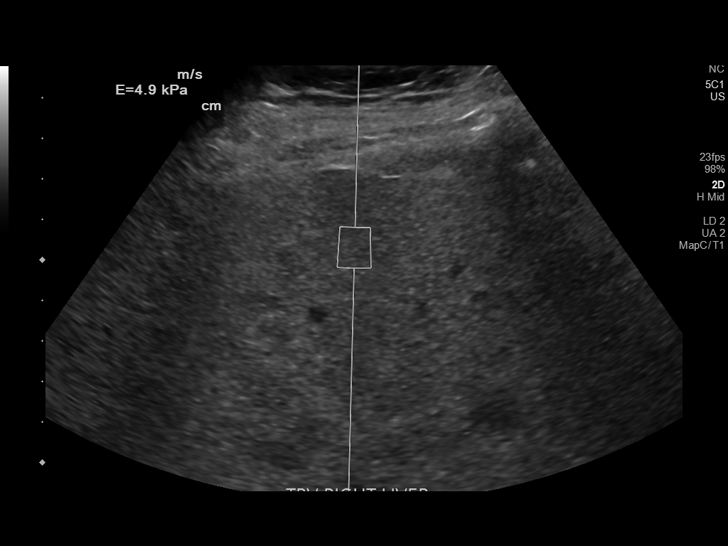
[im 18/22]
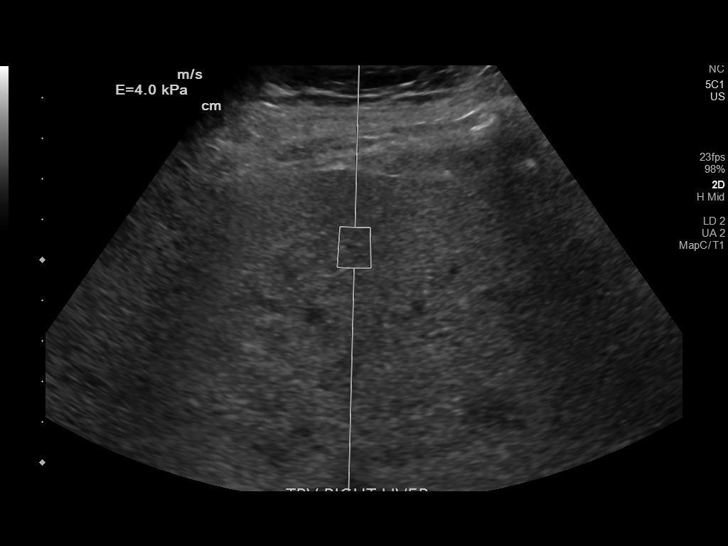
[im 20/22]
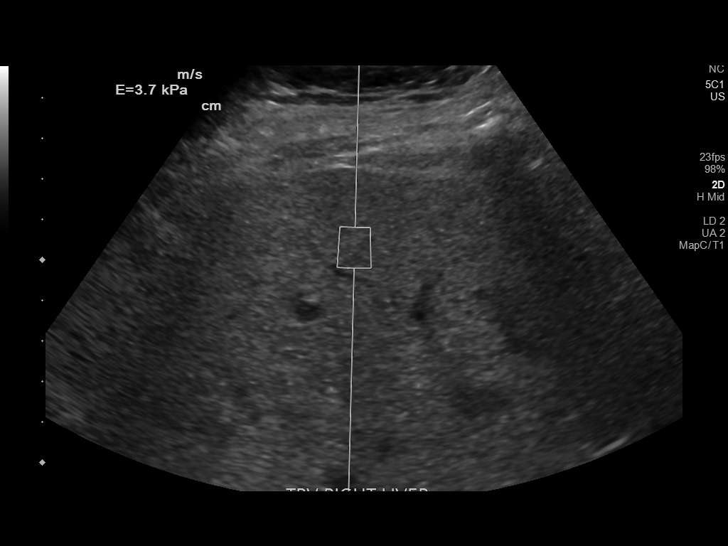
[im 22/22]
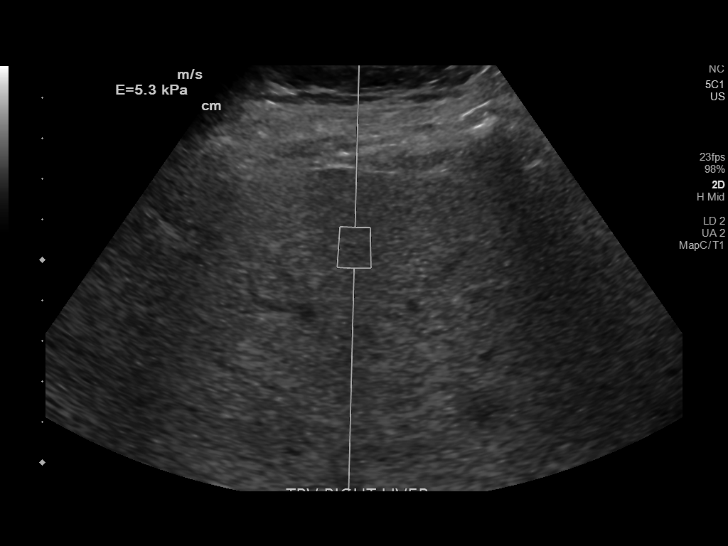

[12 of 22 positions shown; findings below may reference images not displayed]

FINDINGS: ULTRASOUND ABDOMEN

Gallbladder: No gallstones or wall thickening visualized. No
sonographic Murphy sign noted by sonographer.

Common bile duct: Diameter: 5 mm

Liver: Liver parenchyma is diffusely prominently echogenic with
posterior acoustic attenuation, compatible with diffuse hepatic
steatosis. No definite liver surface irregularity. No liver mass
demonstrated, noting decreased sensitivity in the setting of an
echogenic liver. Portal vein is patent on color Doppler imaging with
normal direction of blood flow towards the liver.

IVC: No abnormality visualized.

Pancreas: Visualized portion unremarkable.

Spleen: Mild splenomegaly (craniocaudal splenic length 14.8 cm), not
appreciably changed. No splenic mass.

Right Kidney: Length: 11.3 cm. Echogenicity within normal limits. No
mass or hydronephrosis visualized.

Left Kidney: Length: 13.3 cm. Echogenicity within normal limits. No
mass or hydronephrosis visualized.

Abdominal aorta: No aneurysm visualized.

Other findings: None.

ULTRASOUND HEPATIC ELASTOGRAPHY

Device: Siemens Helix VTQ

Patient position: Supine

Transducer 5C1

Number of measurements: 10

Hepatic segment:  7

Median velocity:   1.60 m/sec

IQR:

IQR/Median velocity ratio:

Corresponding Metavir fibrosis score:  F2 + some F3

Risk of fibrosis: Moderate

Limitations of exam: None

Please note that abnormal shear wave velocities may also be
identified in clinical settings other than with hepatic fibrosis,
such as: acute hepatitis, elevated right heart and central venous
pressures including use of beta blockers, Appelberg disease
(Liao), infiltrative processes such as
mastocytosis/amyloidosis/infiltrative tumor, extrahepatic
cholestasis, in the post-prandial state, and liver transplantation.
Correlation with patient history, laboratory data, and clinical
condition recommended.
IMPRESSION: ULTRASOUND ABDOMEN:

1. Diffuse hepatic steatosis. No overt morphologic changes of
hepatic cirrhosis. No liver masses, noting decreased sensitivity in
the setting of an echogenic liver.
2. Stable mild splenomegaly.

ULTRASOUND HEPATIC ELASTOGRAPHY:

Median hepatic shear wave velocity is calculated at 1.60 m/sec.

Corresponding Metavir fibrosis score is F2 + some F3.

Risk of fibrosis is Moderate.

Follow-up: Additional testing appropriate.

## 2019-09-14 ENCOUNTER — Telehealth: Payer: Self-pay | Admitting: Gastroenterology

## 2019-09-14 MED ORDER — PANTOPRAZOLE SODIUM 40 MG PO TBEC: 40.0000 mg | DELAYED_RELEASE_TABLET | Freq: Every day | ORAL | 11 refills | Status: DC

## 2019-09-14 NOTE — Telephone Encounter (Signed)
Prior Authorization was completed for this medication and was denied for Protonix twice daily. Patient says that she doesn't always take the Protonix twice daily most days she only takes it once . Only uses it twice daily when she has a flare up and her acid reflux is bothering her. Patient also said that her primary care doctor has given her Famotidine 20 mg but she hasnt started taking this. Patient would like to know what you would like her to take since her insurance will not pay for her Protonix twice daily. Please advise.

## 2019-09-14 NOTE — Telephone Encounter (Signed)
Patient calling- states that her pantoprazole still needs the prior authorization. She received a letter from [employer] as to why they denied her and she would like to speak to you about.

## 2019-09-14 NOTE — Telephone Encounter (Signed)
Lets do Protonix 40 mg p.o. once a day, half an hour before breakfast, 30, 11 refills Do Pepcid 20 mg at bedtime.  RG

## 2019-09-14 NOTE — Telephone Encounter (Signed)
Prescription sent to patients pharmacy, patient notified and voiced understanding of new instructions.

## 2020-09-16 ENCOUNTER — Other Ambulatory Visit: Payer: Self-pay | Admitting: Gastroenterology

## 2021-05-04 ENCOUNTER — Other Ambulatory Visit: Payer: Self-pay | Admitting: Gastroenterology

## 2022-03-24 ENCOUNTER — Encounter: Payer: Self-pay | Admitting: Internal Medicine

## 2022-04-12 ENCOUNTER — Encounter: Payer: Self-pay | Admitting: Gastroenterology

## 2022-05-03 ENCOUNTER — Ambulatory Visit (AMBULATORY_SURGERY_CENTER): Payer: Self-pay | Admitting: *Deleted

## 2022-05-03 VITALS — Ht 67.0 in | Wt 195.2 lb

## 2022-05-03 DIAGNOSIS — Z86010 Personal history of colonic polyps: Secondary | ICD-10-CM

## 2022-05-03 MED ORDER — NA SULFATE-K SULFATE-MG SULF 17.5-3.13-1.6 GM/177ML PO SOLN: 1.0000 | Freq: Once | ORAL | 0 refills | Status: AC

## 2022-05-03 NOTE — Progress Notes (Signed)
No egg or soy allergy known to patient  No issues known to pt with past sedation with any surgeries or procedures Patient denies ever being told they had issues or difficulty with intubation  No FH of Malignant Hyperthermia Pt is not on diet pills Pt is not on  home 02  Pt is not on blood thinners  Pt denies issues with constipation  No A fib or A flutter Have any cardiac testing pending--no Pt instructed to use Single[employer]com or GoodRx for a price reduction on prep   

## 2022-05-17 ENCOUNTER — Encounter: Payer: Self-pay | Admitting: Gastroenterology

## 2022-05-24 ENCOUNTER — Encounter: Payer: Self-pay | Admitting: Gastroenterology

## 2022-05-24 ENCOUNTER — Ambulatory Visit (AMBULATORY_SURGERY_CENTER): Payer: Managed Care, Other (non HMO) | Admitting: Gastroenterology

## 2022-05-24 VITALS — BP 122/70 | HR 74 | Temp 98.2°F | Resp 17 | Ht 67.0 in | Wt 195.2 lb

## 2022-05-24 DIAGNOSIS — Z09 Encounter for follow-up examination after completed treatment for conditions other than malignant neoplasm: Secondary | ICD-10-CM | POA: Diagnosis present

## 2022-05-24 DIAGNOSIS — Z86010 Personal history of colonic polyps: Secondary | ICD-10-CM

## 2022-05-24 DIAGNOSIS — D122 Benign neoplasm of ascending colon: Secondary | ICD-10-CM | POA: Diagnosis not present

## 2022-05-24 DIAGNOSIS — D125 Benign neoplasm of sigmoid colon: Secondary | ICD-10-CM

## 2022-05-24 DIAGNOSIS — D124 Benign neoplasm of descending colon: Secondary | ICD-10-CM

## 2022-05-24 MED ORDER — SODIUM CHLORIDE 0.9 % IV SOLN: 500.0000 mL | INTRAVENOUS | Status: DC

## 2022-05-24 NOTE — Patient Instructions (Addendum)
Read all of the handouts given to you by your recovery room nurse.  Resume your medications today as ordered.  YOU HAD AN ENDOSCOPIC PROCEDURE TODAY AT [facility]:   Refer to the procedure report that was given to you for any specific questions about what was found during the examination.  If the procedure report does not answer your questions, please call your gastroenterologist to clarify.  If you requested that your care partner not be given the details of your procedure findings, then the procedure report has been included in a sealed envelope for you to review at your convenience later.  YOU SHOULD EXPECT: Some feelings of bloating in the abdomen. Passage of more gas than usual.  Walking can help get rid of the air that was put into your GI tract during the procedure and reduce the bloating. If you had a lower endoscopy (such as a colonoscopy or flexible sigmoidoscopy) you may notice spotting of blood in your stool or on the toilet paper. If you underwent a bowel prep for your procedure, you may not have a normal bowel movement for a few days.  Please Note:  You might notice some irritation and congestion in your nose or some drainage.  This is from the oxygen used during your procedure.  There is no need for concern and it should clear up in a day or so.  SYMPTOMS TO REPORT IMMEDIATELY:  Following lower endoscopy (colonoscopy or flexible sigmoidoscopy):  Excessive amounts of blood in the stool  Significant tenderness or worsening of abdominal pains  Swelling of the abdomen that is new, acute  Fever of 100F or higher   Black, tarry-looking stools  For urgent or emergent issues, a gastroenterologist can be reached at any hour by calling [phone]. Do not use MyChart messaging for urgent concerns.    DIET:  We do recommend a small meal at first, but then you may proceed to your regular diet.  Drink plenty of fluids but you should avoid alcoholic beverages for 24  hours.  ACTIVITY:  You should plan to take it easy for the rest of today and you should NOT DRIVE or use heavy machinery until tomorrow (because of the sedation medicines used during the test).    FOLLOW UP: Our staff will call the number listed on your records the next business day following your procedure.  We will call around 7:15- 8:00 am to check on you and address any questions or concerns that you may have regarding the information given to you following your procedure. If we do not reach you, we will leave a message.     If any biopsies were taken you will be contacted by phone or by letter within the next 1-3 weeks.  Please call us at [phone] if you have not heard about the biopsies in 3 weeks.    SIGNATURES/CONFIDENTIALITY: You and/or your care partner have signed paperwork which will be entered into your electronic medical record.  These signatures attest to the fact that that the information above on your After Visit Summary has been reviewed and is understood.  Full responsibility of the confidentiality of this discharge information lies with you and/or your care-partner.

## 2022-05-24 NOTE — Progress Notes (Signed)
Report to PACU, RN, vss, BBS= Clear.  

## 2022-05-24 NOTE — Progress Notes (Signed)
Called to room to assist during endoscopic procedure.  Patient ID and intended procedure confirmed with present staff. Received instructions for my participation in the procedure from the performing physician.  

## 2022-05-24 NOTE — Progress Notes (Signed)
Chief Complaint: FU  Referring Provider:  Nicoletta Dress, MD      ASSESSMENT AND PLAN;   #1.  Iron deficiency without anemia.  Heme-neg 2018/2020. Neg colon to TI 11/2015, neg EGD 02/2017. CT 12/2015: Neg except for mild splenomegaly, fatty liver.  No liver cirrhosis/portal hypertension. Nl Plts.  #2.  Fatty liver with mildly abn LFTs. USE 12/2018 (F2/some F3 fibrosis). [place] 01/2017-fatty liver, mild splenomegaly 16.1 cm.  No liver cirrhosis/portal hypertension. Neg acute hepatitis panel, ANA, AMA, ASMA 11/2015. No ETOH. S/P vaccine for A and B.  #3. GERD  Plan: -Protonix 40 mg po BID (#60), 6 refills. -Start exercising and reduce weight.  Exercise like walking 30 min/day, at least 3 x week.  -Record wt every day and bring it along the at the next FU visit. -Change diet to more grilled foods rather than fried foods. Can try Mediterranean diet. -Can have up to 2 cups of coffee per day. -Continue Vitamin E 400 IU po QD. -FU in 24 weeks.  Aim is to reduce 6 lbs more over next 24 weeks (aim 196lb) -Continue milk thistle bid.   HPI:    Maria Weber is a 61 y.o. female  For iron deficiency without anemia. Had heme-negative stools She is doing well with twice a day iron supplements.  Has been having heart burn when she takes once a day Protonix.  She was doing much better with twice a day.  Would like to go back on it.  She understands the risks and benefits.  No nausea, vomiting,regurgitation, odynophagia or dysphagia.  No significant diarrhea or constipation.  There is no melena or hematochezia.  No abdominal pain.  Has lost 6 pounds.  On her scale she currently weighs 202 pounds.  This was despite being on prednisone for skin rash previously.  She has been started on Trulicity for prediabetes.  Has been walking regularly except lately when the weather is cold.   Ultrasound elastography showed F2/some F3 fibrosis.  At moderate risk for cirrhosis. Spleen was mildly enlarged.   No portal hypertension.  Platelets were normal previously.  No abdominal pain.  Denies vaginal bleeding/hematuria/nosebleeds.  No jaundice dark urine or pale stools.  Labs-September 26, 2018 hemoglobin 13.1, MCV 76, platelets 203, WBC count 9.6.  Liver function tests per patient AST/ALT in 50s. Labs from 06/02/2019: Hemoglobin 14.8, MCV 81.  Platelets 206K AST 45, ALT 46, albumin 4.3, sodium 145, creatinine 0.8, glucose 103.  Past GI procedures: -Colonoscopy 12/15/2015 ([facility])-good prep, 6 mm colonic polyp status post polypectomy.  Biopsies hyperplastic, mild sigmoid diverticulosis.  Otherwise normal to TI.  03/2013-hyperplastic colonic polyps, small internal hemorrhoids. -EGD 03/07/2017 normal.  Previously negative small bowel biopsies for celiac.  Gastric biopsies were negative for intestinal metaplasia at last EGD.  SH-fell in our office previously/had grandbaby with her at that time. Past Medical History:  Diagnosis Date   Allergic arthritis    Arthritis    Asthma    GERD (gastroesophageal reflux disease)    Goiter, non-toxic    Hypertension    Hypothyroidism    Iron deficiency anemia    Pneumonia    october 2019 and february 2020 [place]    Prediabetes    Rosacea    Sleep apnea    Vitamin D deficiency     Past Surgical History:  Procedure Laterality Date   CESAREAN SECTION     x2   COLONOSCOPY  12/15/2015   Colonic polyp status post polypectomy. Mild  sigmoid diverticulosis. No explanation of iron deficiency anemia   ESOPHAGOGASTRODUODENOSCOPY  03/07/2017   Mild gastritis. Otherwise normal EGD    Family History  Problem Relation Age of Onset   Bladder Cancer Father    Diabetes Paternal Uncle    Esophageal cancer Neg Hx    Colon cancer Neg Hx    Colon polyps Neg Hx    Crohn's disease Neg Hx    Rectal cancer Neg Hx    Stomach cancer Neg Hx    Ulcerative colitis Neg Hx     Social History   Tobacco Use   Smoking status: Former    Passive exposure: Past (daughter  smoked)   Smokeless tobacco: Never   Tobacco comments:    smoked for one year when she was 25  Vaping Use   Vaping Use: Never used  Substance Use Topics   Alcohol use: Not Currently   Drug use: Never    Current Outpatient Medications  Medication Sig Dispense Refill   albuterol (VENTOLIN HFA) 108 (90 Base) MCG/ACT inhaler Inhale into the lungs.     ARMOUR THYROID 60 MG tablet Take 60 mg by mouth daily.     Azelastine-Fluticasone 137-50 MCG/ACT SUSP      famotidine (PEPCID) 20 MG tablet Take 20 mg by mouth 2 (two) times daily.     Fluticasone-Umeclidin-Vilant (TRELEGY ELLIPTA) 100-62.5-25 MCG/ACT AEPB      lisinopril-hydrochlorothiazide (ZESTORETIC) 20-12.5 MG tablet 0.5 tablets daily.     montelukast (SINGULAIR) 10 MG tablet Take 10 mg by mouth daily.     pantoprazole (PROTONIX) 40 MG tablet TAKE 1 TABLET BY MOUTH EVERY DAY 30 tablet 7   rOPINIRole (REQUIP) 0.5 MG tablet Take 1.5 mg by mouth at bedtime.     traZODone (DESYREL) 150 MG tablet 200 mg daily.     VITAMIN E PO Take by mouth daily.     Zinc 50 MG TABS Take by mouth daily.     CO-ENZYME Q10 PO Take 400 mg by mouth daily. (Patient not taking: Reported on 05/03/2022)     ferrous sulfate (IRON SUPPLEMENT) 325 (65 FE) MG tablet Take 325 mg by mouth daily with breakfast.     Milk Thstle-Artchke-Dand-Licor (SUPER MILK THISTLE X PO) Take 2 tablets by mouth daily. (Patient not taking: Reported on 05/03/2022)     mometasone (ELOCON) 0.1 % cream SMARTSIG:Application Topical Daily PRN     Multiple Vitamins-Minerals (ZINC PO) Take by mouth. (Patient not taking: Reported on 93/26/7124)     TRULICITY 1.5 PY/0.9XI SOPN 1 (ONE) SOLUTION PEN INJECTOR WEEKLY     Current Facility-Administered Medications  Medication Dose Route Frequency Provider Last Rate Last Admin   0.9 %  sodium chloride infusion  500 mL Intravenous Continuous Jackquline Denmark, MD        Allergies  Allergen Reactions   Iodine    Povidone    Sulfa Antibiotics      Review of Systems:  neg     Physical Exam:    BP 130/80   Pulse 78   Temp 98.2 F (36.8 C) (Temporal)   Ht '5\' 7"'$  (1.702 m)   Wt 195 lb 3.2 oz (88.5 kg)   SpO2 100%   BMI 30.57 kg/m  Filed Weights   05/24/22 1019  Weight: 195 lb 3.2 oz (88.5 kg)   Constitutional:  Well-developed, in no acute distress. Psychiatric: Normal mood and affect. Behavior is normal. Telemetry visit  Data Reviewed: I have personally reviewed following labs and imaging studies  Labs -September 26, 2018 hemoglobin 13.1, MCV 76, platelets 203, WBC count 9.6.  Liver function tests per patient AST/ALT in 50s.   This service was provided via telemedicine.  The patient was located at home.  The provider was located in office.  The patient did consent to this telephone visit and is aware of possible charges through their insurance for this visit.  The patient was referred by Dr. Delena Bali.   Time spent on call: 25 min   Carmell Austria, MD 05/24/2022, 10:26 AM  Cc: Nicoletta Dress, MD Anda Kraft)

## 2022-05-24 NOTE — Op Note (Signed)
[facility] Patient Name: Maria Weber Procedure Date: 05/24/2022 10:28 AM MRN: [id number] Endoscopist: Jackquline Denmark , MD, [id number] Age: 61 Referring MD:  Date of Birth: 12-11-1960 Gender: Female Account #: [id number] Procedure:                Colonoscopy Indications:              High risk colon cancer surveillance: Personal                            history of colonic polyps Medicines:                Monitored Anesthesia Care Procedure:                Pre-Anesthesia Assessment:                           - Prior to the procedure, a History and Physical                            was performed, and patient medications and                            allergies were reviewed. The patient's tolerance of                            previous anesthesia was also reviewed. The risks                            and benefits of the procedure and the sedation                            options and risks were discussed with the patient.                            All questions were answered, and informed consent                            was obtained. Prior Anticoagulants: The patient has                            taken no anticoagulant or antiplatelet agents. ASA                            Grade Assessment: II - A patient with mild systemic                            disease. After reviewing the risks and benefits,                            the patient was deemed in satisfactory condition to                            undergo the procedure.  After obtaining informed consent, the colonoscope                            was passed under direct vision. Throughout the                            procedure, the patient's blood pressure, pulse, and                            oxygen saturations were monitored continuously. The                            Olympus CF-HQ190L [id number]) Colonoscope was                            introduced through the anus and advanced  to the the                            cecum, identified by appendiceal orifice and                            ileocecal valve. The colonoscopy was performed                            without difficulty. The patient tolerated the                            procedure well. The quality of the bowel                            preparation was good. The ileocecal valve,                            appendiceal orifice, and rectum were photographed. Scope In: 10:37:54 AM Scope Out: 10:58:10 AM Scope Withdrawal Time: 0 hours 13 minutes 36 seconds  Total Procedure Duration: 0 hours 20 minutes 16 seconds  Findings:                 Three sessile polyps were found in the proximal                            ascending colon and mid ascending colon. The polyps                            were 3 to 4 mm in size. These polyps were removed                            with a cold snare. Resection and retrieval were                            complete.                           Five sessile polyps were found in the proximal  sigmoid colon, mid sigmoid colon and descending                            colon. The polyps were 4 to 6 mm in size. These                            polyps were removed with a cold snare. Resection                            and retrieval were complete.                           A few medium-mouthed diverticula were found in the                            sigmoid colon.                           Non-bleeding internal hemorrhoids were found during                            retroflexion. The hemorrhoids were small and Grade                            I (internal hemorrhoids that do not prolapse).                           The exam was otherwise without abnormality on                            direct and retroflexion views. Complications:            No immediate complications. Estimated Blood Loss:     Estimated blood loss: none. Impression:               - Three  3 to 4 mm polyps in the proximal ascending                            colon and in the mid ascending colon, removed with                            a cold snare. Resected and retrieved.                           - Five 4 to 6 mm polyps in the proximal sigmoid                            colon, in the mid sigmoid colon and in the                            descending colon, removed with a cold snare.                            Resected and retrieved.                           -  Mild sigmoid diverticulosis.                           - Non-bleeding internal hemorrhoids.                           - The examination was otherwise normal on direct                            and retroflexion views. Recommendation:           - Patient has a contact number available for                            emergencies. The signs and symptoms of potential                            delayed complications were discussed with the                            patient. Return to normal activities tomorrow.                            Written discharge instructions were provided to the                            patient.                           - Resume previous diet.                           - Continue present medications.                           - Await pathology results.                           - Repeat colonoscopy for surveillance based on                            pathology results.                           - The findings and recommendations were discussed                            with the patient's family (david). Jackquline Denmark, MD 05/24/2022 11:02:41 AM This report has been signed electronically.

## 2022-05-24 NOTE — Progress Notes (Signed)
Pt's states no medical or surgical changes since previsit or office visit. 

## 2022-05-25 ENCOUNTER — Telehealth: Payer: Self-pay

## 2022-05-25 NOTE — Telephone Encounter (Signed)
  Follow up Call-     05/24/2022   10:17 AM  Call back number  Post procedure Call Back phone  # [phone]  Permission to leave phone message Yes     Patient questions:  Do you have a fever, pain , or abdominal swelling? No. Pain Score  0 *  Have you tolerated food without any problems? Yes.    Have you been able to return to your normal activities? Yes.    Do you have any questions about your discharge instructions: Diet   No. Medications  No. Follow up visit  No.  Do you have questions or concerns about your Care? No.  Actions: * If pain score is 4 or above: No action needed, pain <4.

## 2022-05-31 ENCOUNTER — Encounter: Payer: Self-pay | Admitting: Gastroenterology

## 2023-03-02 ENCOUNTER — Other Ambulatory Visit (HOSPITAL_COMMUNITY): Payer: Self-pay | Admitting: Internal Medicine

## 2023-03-02 DIAGNOSIS — K760 Fatty (change of) liver, not elsewhere classified: Secondary | ICD-10-CM

## 2023-03-28 ENCOUNTER — Ambulatory Visit (HOSPITAL_COMMUNITY)
Admission: RE | Admit: 2023-03-28 | Discharge: 2023-03-28 | Disposition: A | Discharge status: Home / Self Care | Payer: 59 | Source: Ambulatory Visit | Attending: Internal Medicine | Admitting: Internal Medicine

## 2023-03-28 DIAGNOSIS — K760 Fatty (change of) liver, not elsewhere classified: Secondary | ICD-10-CM | POA: Insufficient documentation
